# Patient Record
Sex: Male | Born: 1940 | Race: White | Hispanic: No | Marital: Married
Health system: Southern US, Community
[De-identification: ages and names within clinical notes are randomized; demographics above are authoritative.]

---

## 2004-11-01 ENCOUNTER — Ambulatory Visit: Payer: Self-pay | Admitting: Internal Medicine

## 2010-04-09 ENCOUNTER — Inpatient Hospital Stay: Payer: Self-pay | Admitting: Internal Medicine

## 2010-04-11 LAB — PSA

## 2010-11-14 ENCOUNTER — Ambulatory Visit: Payer: Self-pay | Admitting: Dermatology

## 2010-12-06 ENCOUNTER — Ambulatory Visit: Payer: Self-pay | Admitting: Dermatology

## 2010-12-13 ENCOUNTER — Ambulatory Visit: Payer: Self-pay | Admitting: Internal Medicine

## 2011-01-25 ENCOUNTER — Ambulatory Visit: Payer: Self-pay | Admitting: Internal Medicine

## 2011-05-17 ENCOUNTER — Ambulatory Visit: Payer: Self-pay | Admitting: Unknown Physician Specialty

## 2011-05-24 ENCOUNTER — Ambulatory Visit: Payer: Self-pay | Admitting: Rheumatology

## 2011-08-02 ENCOUNTER — Emergency Department: Payer: Self-pay | Admitting: Emergency Medicine

## 2011-08-02 LAB — COMPREHENSIVE METABOLIC PANEL
Albumin: 3.3 g/dL — ABNORMAL LOW (ref 3.4–5.0)
Alkaline Phosphatase: 77 U/L (ref 50–136)
Anion Gap: 9 (ref 7–16)
BUN: 9 mg/dL (ref 7–18)
Bilirubin,Total: 0.4 mg/dL (ref 0.2–1.0)
Calcium, Total: 9.1 mg/dL (ref 8.5–10.1)
Chloride: 93 mmol/L — ABNORMAL LOW (ref 98–107)
Co2: 35 mmol/L — ABNORMAL HIGH (ref 21–32)
Creatinine: 0.46 mg/dL — ABNORMAL LOW (ref 0.60–1.30)
EGFR (African American): >60
EGFR (Non-African Amer.): >60
Glucose: 85 mg/dL (ref 65–99)
Potassium: 3.9 mmol/L (ref 3.5–5.1)
SGOT(AST): 34 U/L (ref 15–37)
SGPT (ALT): 24 U/L
Total Protein: 7.3 g/dL (ref 6.4–8.2)

## 2011-08-02 LAB — CBC
HCT: 39.4 % — ABNORMAL LOW (ref 40.0–52.0)
MCV: 102 fL — ABNORMAL HIGH (ref 80–100)
Platelet: 278 10*3/uL (ref 150–440)
RBC: 3.87 10*6/uL — ABNORMAL LOW (ref 4.40–5.90)
WBC: 12.3 10*3/uL — ABNORMAL HIGH (ref 3.8–10.6)

## 2011-08-02 LAB — TROPONIN I: Troponin-I: <0.02 ng/mL

## 2011-08-03 ENCOUNTER — Inpatient Hospital Stay: Payer: Self-pay | Admitting: Student

## 2011-08-03 LAB — COMPREHENSIVE METABOLIC PANEL
Alkaline Phosphatase: 81 U/L (ref 50–136)
Bilirubin,Total: 0.3 mg/dL (ref 0.2–1.0)
Calcium, Total: 9.7 mg/dL (ref 8.5–10.1)
Chloride: 92 mmol/L — ABNORMAL LOW (ref 98–107)
Co2: 36 mmol/L — ABNORMAL HIGH (ref 21–32)
Creatinine: 0.58 mg/dL — ABNORMAL LOW (ref 0.60–1.30)
EGFR (Non-African Amer.): >60
Osmolality: 271 (ref 275–301)
SGPT (ALT): 24 U/L
Sodium: 136 mmol/L (ref 136–145)

## 2011-08-03 LAB — CBC
MCHC: 34.1 g/dL (ref 32.0–36.0)
Platelet: 291 10*3/uL (ref 150–440)
RDW: 16.5 % — ABNORMAL HIGH (ref 11.5–14.5)
WBC: 10.1 10*3/uL (ref 3.8–10.6)

## 2011-08-04 LAB — BASIC METABOLIC PANEL
Anion Gap: 8 (ref 7–16)
BUN: 17 mg/dL (ref 7–18)
Co2: 36 mmol/L — ABNORMAL HIGH (ref 21–32)
Creatinine: 0.54 mg/dL — ABNORMAL LOW (ref 0.60–1.30)
EGFR (African American): >60
Glucose: 120 mg/dL — ABNORMAL HIGH (ref 65–99)
Osmolality: 278 (ref 275–301)
Sodium: 138 mmol/L (ref 136–145)

## 2011-08-04 LAB — CBC WITH DIFFERENTIAL/PLATELET
Basophil #: 0 10*3/uL (ref 0.0–0.1)
Basophil %: 0 %
Eosinophil %: 0 %
HGB: 12.3 g/dL — ABNORMAL LOW (ref 13.0–18.0)
Lymphocyte #: 0.5 10*3/uL — ABNORMAL LOW (ref 1.0–3.6)
MCH: 33.6 pg (ref 26.0–34.0)
MCV: 101 fL — ABNORMAL HIGH (ref 80–100)
Monocyte #: 0.1 10*3/uL (ref 0.0–0.7)
Neutrophil %: 91.3 %
Platelet: 263 10*3/uL (ref 150–440)
RDW: 16.5 % — ABNORMAL HIGH (ref 11.5–14.5)

## 2011-08-04 LAB — MAGNESIUM: Magnesium: 1.9 mg/dL

## 2011-08-04 LAB — PROTIME-INR
INR: 0.9
Prothrombin Time: 12 secs (ref 11.5–14.7)

## 2011-08-06 LAB — EXPECTORATED SPUTUM ASSESSMENT W GRAM STAIN, RFLX TO RESP C

## 2011-08-07 LAB — CULTURE, BLOOD (SINGLE)

## 2011-08-08 LAB — CULTURE, BLOOD (SINGLE)

## 2012-04-04 ENCOUNTER — Inpatient Hospital Stay: Payer: Self-pay | Admitting: Internal Medicine

## 2012-04-04 LAB — COMPREHENSIVE METABOLIC PANEL
Alkaline Phosphatase: 160 U/L — ABNORMAL HIGH (ref 50–136)
Anion Gap: 11 (ref 7–16)
Bilirubin,Total: 0.7 mg/dL (ref 0.2–1.0)
Chloride: 85 mmol/L — ABNORMAL LOW (ref 98–107)
Co2: 39 mmol/L — ABNORMAL HIGH (ref 21–32)
EGFR (Non-African Amer.): >60
Osmolality: 269 (ref 275–301)
Potassium: 2.8 mmol/L — ABNORMAL LOW (ref 3.5–5.1)
SGPT (ALT): 21 U/L (ref 12–78)
Sodium: 135 mmol/L — ABNORMAL LOW (ref 136–145)
Total Protein: 6.3 g/dL — ABNORMAL LOW (ref 6.4–8.2)

## 2012-04-04 LAB — ETHANOL: Ethanol: 55 mg/dL

## 2012-04-04 LAB — CBC WITH DIFFERENTIAL/PLATELET
Basophil %: 0.2 %
Eosinophil #: 0 10*3/uL (ref 0.0–0.7)
Eosinophil %: 0.4 %
HGB: 10.3 g/dL — ABNORMAL LOW (ref 13.0–18.0)
Lymphocyte #: 0.1 10*3/uL — ABNORMAL LOW (ref 1.0–3.6)
Lymphocyte %: 1.4 %
MCH: 34.8 pg — ABNORMAL HIGH (ref 26.0–34.0)
MCV: 104 fL — ABNORMAL HIGH (ref 80–100)
Monocyte #: 0.2 x10 3/mm (ref 0.2–1.0)
Monocyte %: 1.9 %
Neutrophil #: 9.9 10*3/uL — ABNORMAL HIGH (ref 1.4–6.5)
Neutrophil %: 96.1 %
Platelet: 214 10*3/uL (ref 150–440)
RBC: 2.97 10*6/uL — ABNORMAL LOW (ref 4.40–5.90)
WBC: 10.3 10*3/uL (ref 3.8–10.6)

## 2012-04-04 LAB — PROTIME-INR: Prothrombin Time: 12 secs (ref 11.5–14.7)

## 2012-04-04 LAB — CK TOTAL AND CKMB (NOT AT ARMC): CK, Total: 33 U/L — ABNORMAL LOW (ref 35–232)

## 2012-04-05 LAB — CBC WITH DIFFERENTIAL/PLATELET
Basophil %: 0.1 %
Eosinophil %: 0 %
HCT: 24.4 % — ABNORMAL LOW (ref 40.0–52.0)
HGB: 8.5 g/dL — ABNORMAL LOW (ref 13.0–18.0)
Lymphocyte #: 0.2 10*3/uL — ABNORMAL LOW (ref 1.0–3.6)
MCH: 36.1 pg — ABNORMAL HIGH (ref 26.0–34.0)
MCV: 104 fL — ABNORMAL HIGH (ref 80–100)
Monocyte #: 0.2 x10 3/mm (ref 0.2–1.0)
Neutrophil #: 5.5 10*3/uL (ref 1.4–6.5)
RBC: 2.35 10*6/uL — ABNORMAL LOW (ref 4.40–5.90)
WBC: 5.9 10*3/uL (ref 3.8–10.6)

## 2012-04-05 LAB — BASIC METABOLIC PANEL
Calcium, Total: 7.2 mg/dL — ABNORMAL LOW (ref 8.5–10.1)
Co2: 39 mmol/L — ABNORMAL HIGH (ref 21–32)
Creatinine: 0.43 mg/dL — ABNORMAL LOW (ref 0.60–1.30)
EGFR (African American): >60
EGFR (Non-African Amer.): >60
Potassium: 3.4 mmol/L — ABNORMAL LOW (ref 3.5–5.1)
Sodium: 139 mmol/L (ref 136–145)

## 2012-04-07 LAB — POTASSIUM: Potassium: 4.4 mmol/L (ref 3.5–5.1)

## 2012-04-07 LAB — MAGNESIUM: Magnesium: 2.3 mg/dL

## 2012-04-10 LAB — CULTURE, BLOOD (SINGLE)

## 2012-09-12 ENCOUNTER — Observation Stay: Payer: Self-pay | Admitting: Internal Medicine

## 2012-09-12 LAB — TSH: Thyroid Stimulating Horm: 1.38 u[IU]/mL

## 2012-09-12 LAB — COMPREHENSIVE METABOLIC PANEL
Albumin: 2.9 g/dL — ABNORMAL LOW (ref 3.4–5.0)
Alkaline Phosphatase: 146 U/L — ABNORMAL HIGH (ref 50–136)
Anion Gap: 2 — ABNORMAL LOW (ref 7–16)
BUN: 13 mg/dL (ref 7–18)
Bilirubin,Total: 0.4 mg/dL (ref 0.2–1.0)
Calcium, Total: 8.2 mg/dL — ABNORMAL LOW (ref 8.5–10.1)
Creatinine: 0.52 mg/dL — ABNORMAL LOW (ref 0.60–1.30)
EGFR (African American): >60
Glucose: 100 mg/dL — ABNORMAL HIGH (ref 65–99)
Osmolality: 270 (ref 275–301)
SGPT (ALT): 32 U/L (ref 12–78)
Sodium: 135 mmol/L — ABNORMAL LOW (ref 136–145)

## 2012-09-12 LAB — CBC
HGB: 9.4 g/dL — ABNORMAL LOW (ref 13.0–18.0)
MCV: 92 fL (ref 80–100)
WBC: 19.8 10*3/uL — ABNORMAL HIGH (ref 3.8–10.6)

## 2012-09-12 LAB — TROPONIN I: Troponin-I: <0.02 ng/mL

## 2012-09-13 LAB — CBC WITH DIFFERENTIAL/PLATELET
Basophil %: 0.2 %
Eosinophil #: 0.1 10*3/uL (ref 0.0–0.7)
HGB: 8.4 g/dL — ABNORMAL LOW (ref 13.0–18.0)
Lymphocyte #: 1 10*3/uL (ref 1.0–3.6)
Lymphocyte %: 10.6 %
MCHC: 32.9 g/dL (ref 32.0–36.0)
MCV: 93 fL (ref 80–100)
Monocyte #: 0.8 x10 3/mm (ref 0.2–1.0)
Monocyte %: 8 %
Neutrophil #: 7.9 10*3/uL — ABNORMAL HIGH (ref 1.4–6.5)
Platelet: 246 10*3/uL (ref 150–440)
RBC: 2.76 10*6/uL — ABNORMAL LOW (ref 4.40–5.90)

## 2012-09-13 LAB — BASIC METABOLIC PANEL
BUN: 10 mg/dL (ref 7–18)
Chloride: 97 mmol/L — ABNORMAL LOW (ref 98–107)
Co2: 41 mmol/L (ref 21–32)
EGFR (African American): >60
EGFR (Non-African Amer.): >60
Osmolality: 272 (ref 275–301)
Sodium: 137 mmol/L (ref 136–145)

## 2012-09-16 ENCOUNTER — Ambulatory Visit: Payer: Self-pay | Admitting: Internal Medicine

## 2012-10-02 ENCOUNTER — Inpatient Hospital Stay: Payer: Self-pay | Admitting: Internal Medicine

## 2012-10-02 LAB — BASIC METABOLIC PANEL
Anion Gap: 3 — ABNORMAL LOW (ref 7–16)
BUN: 17 mg/dL (ref 7–18)
Calcium, Total: 9.3 mg/dL (ref 8.5–10.1)
Chloride: 88 mmol/L — ABNORMAL LOW (ref 98–107)
Co2: 44 mmol/L (ref 21–32)
EGFR (Non-African Amer.): >60
Osmolality: 273 (ref 275–301)
Potassium: 3.9 mmol/L (ref 3.5–5.1)
Sodium: 135 mmol/L — ABNORMAL LOW (ref 136–145)

## 2012-10-02 LAB — CBC WITH DIFFERENTIAL/PLATELET
Basophil #: 0 10*3/uL (ref 0.0–0.1)
Eosinophil %: 0.1 %
HCT: 29.2 % — ABNORMAL LOW (ref 40.0–52.0)
HGB: 9.7 g/dL — ABNORMAL LOW (ref 13.0–18.0)
Lymphocyte %: 2 %
MCH: 30.4 pg (ref 26.0–34.0)
MCHC: 33 g/dL (ref 32.0–36.0)
MCV: 92 fL (ref 80–100)
Monocyte #: 0.2 x10 3/mm (ref 0.2–1.0)
Monocyte %: 1.7 %
Neutrophil #: 11.5 10*3/uL — ABNORMAL HIGH (ref 1.4–6.5)
Neutrophil %: 96.1 %
Platelet: 271 10*3/uL (ref 150–440)
WBC: 11.9 10*3/uL — ABNORMAL HIGH (ref 3.8–10.6)

## 2012-10-05 LAB — BASIC METABOLIC PANEL
Anion Gap: 1 — ABNORMAL LOW (ref 7–16)
Creatinine: 0.44 mg/dL — ABNORMAL LOW (ref 0.60–1.30)
EGFR (African American): >60
EGFR (Non-African Amer.): >60
Glucose: 112 mg/dL — ABNORMAL HIGH (ref 65–99)
Osmolality: 274 (ref 275–301)
Potassium: 3.5 mmol/L (ref 3.5–5.1)

## 2012-10-16 ENCOUNTER — Ambulatory Visit: Payer: Self-pay | Admitting: Internal Medicine

## 2012-11-16 DEATH — deceased

## 2013-05-14 IMAGING — CT CT CHEST W/ CM
1 of 2 series · 14 of 31 positions shown, 18 images · non-contrast
Comparison: none

REASON FOR EXAM: SOB
COMMENTS:

[Series 2: chest w/ 5.0 i41f 3 · axial · 0.78mm/px · z∈[-386,-111]mm · 14 of 65 slices shown, 18 images]
[im 5/65  mediastinal]
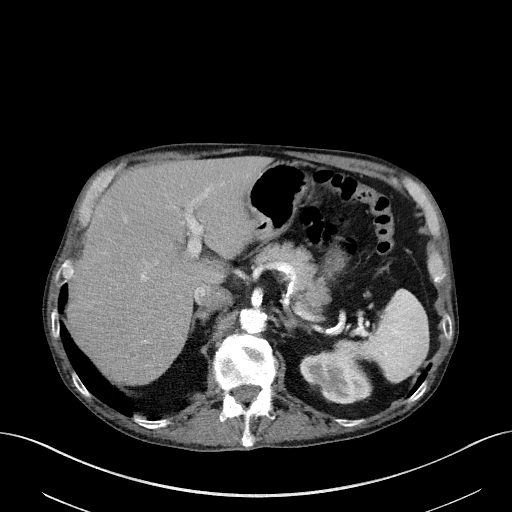
[im 5/65  lung]
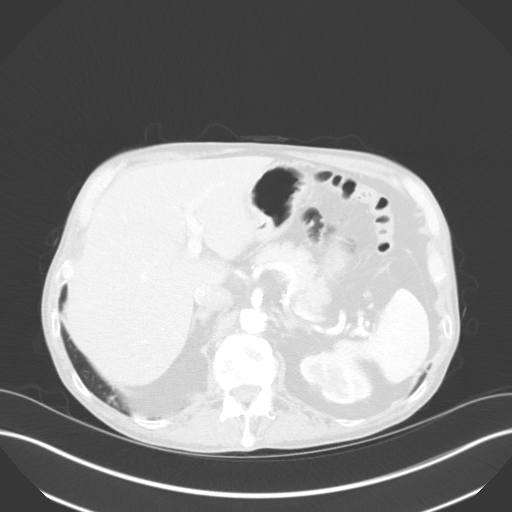
[im 10/65  lung]
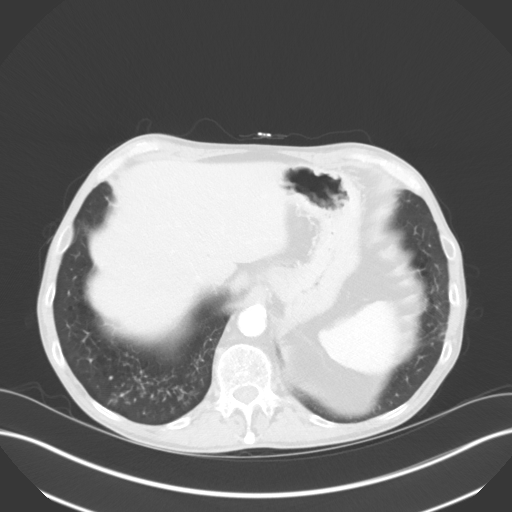
[im 15/65  lung]
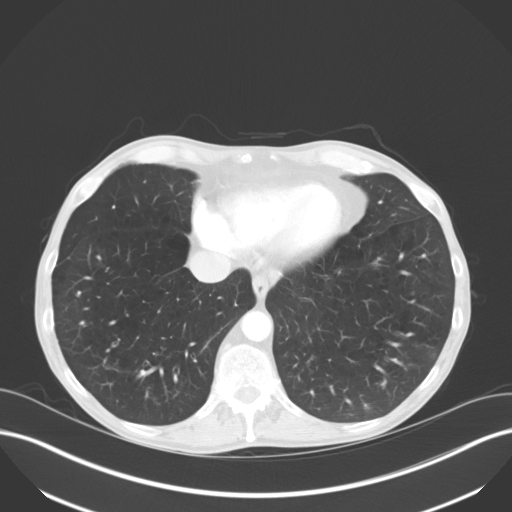
[im 20/65  lung]
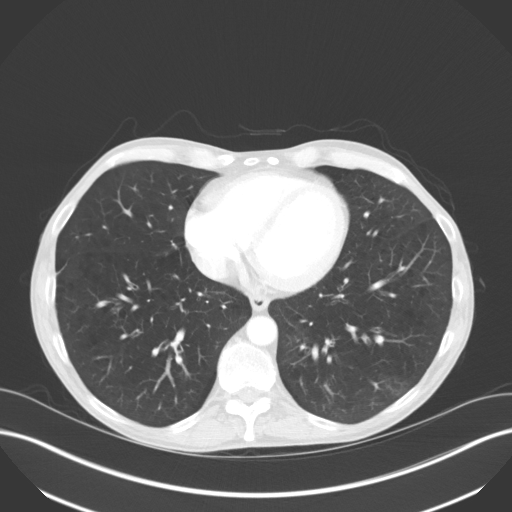
[im 25/65  mediastinal]
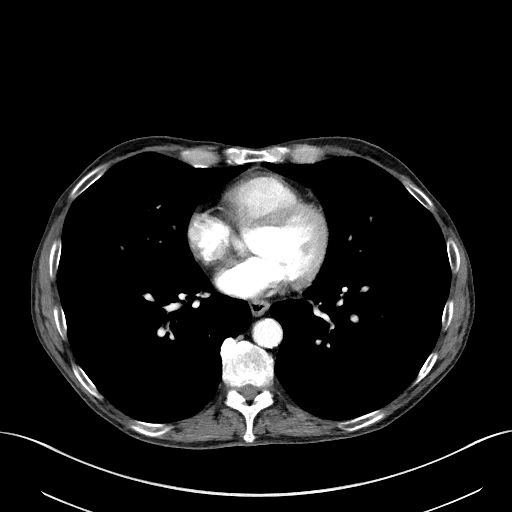
[im 25/65  lung]
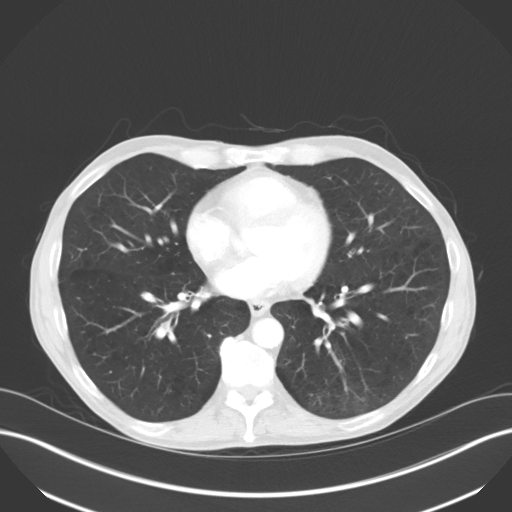
[im 30/65  lung]
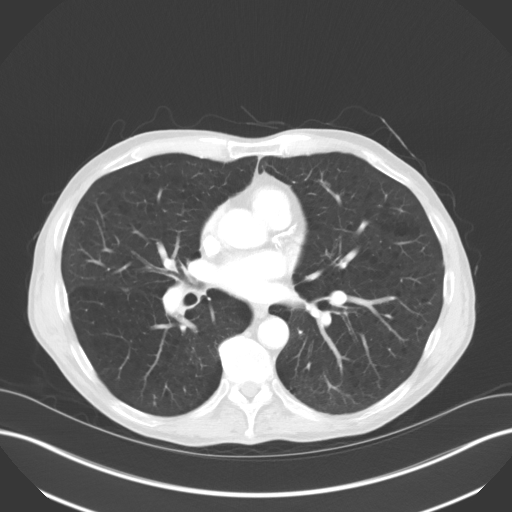
[im 31/65  lung]
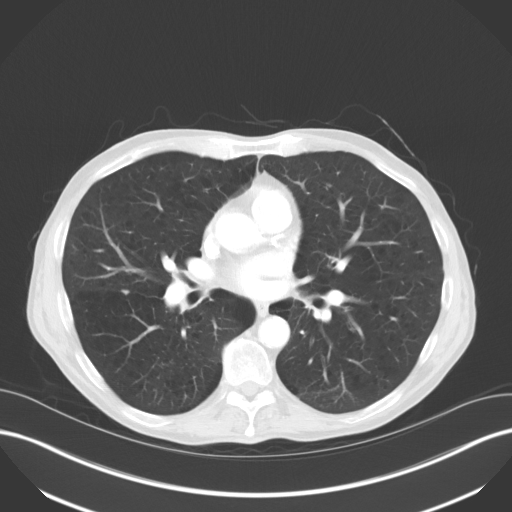
[im 33/65  lung]
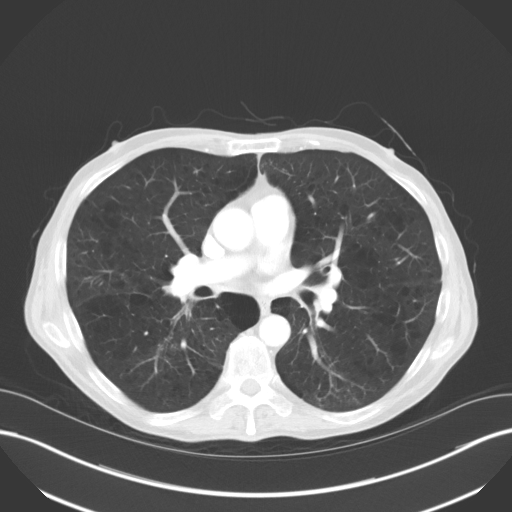
[im 35/65  mediastinal]
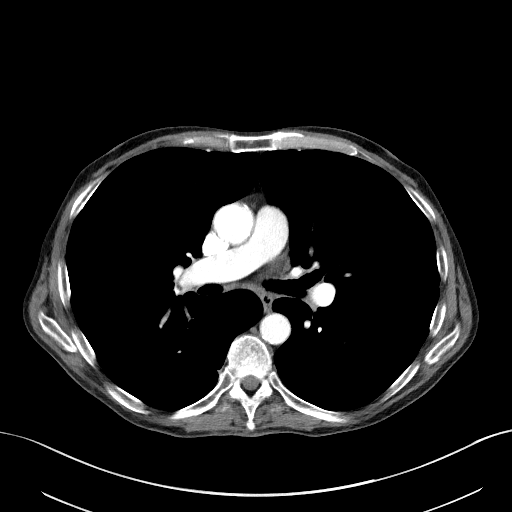
[im 35/65  lung]
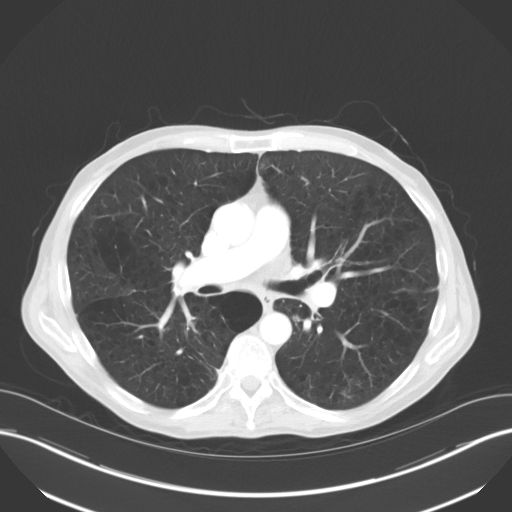
[im 40/65  lung]
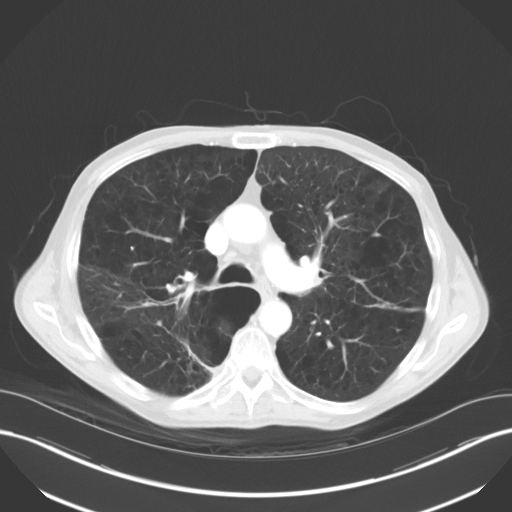
[im 45/65  lung]
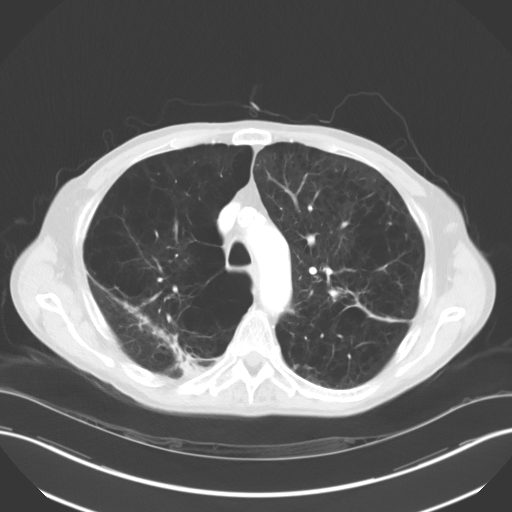
[im 50/65  lung]
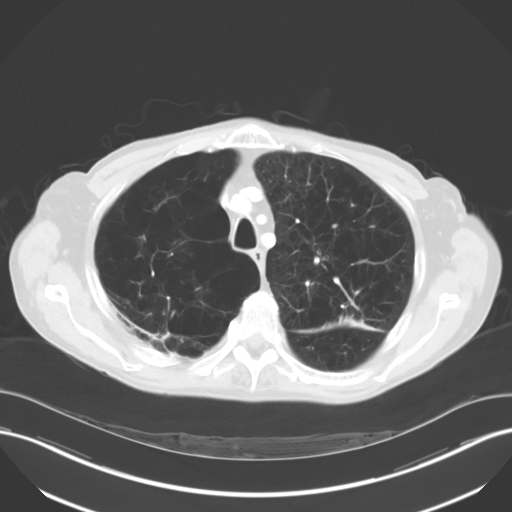
[im 55/65  mediastinal]
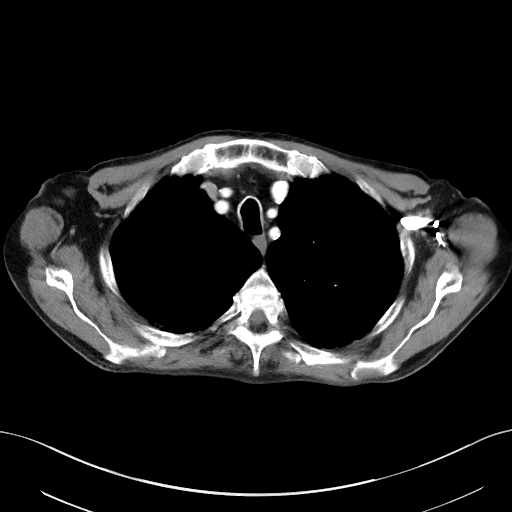
[im 55/65  lung]
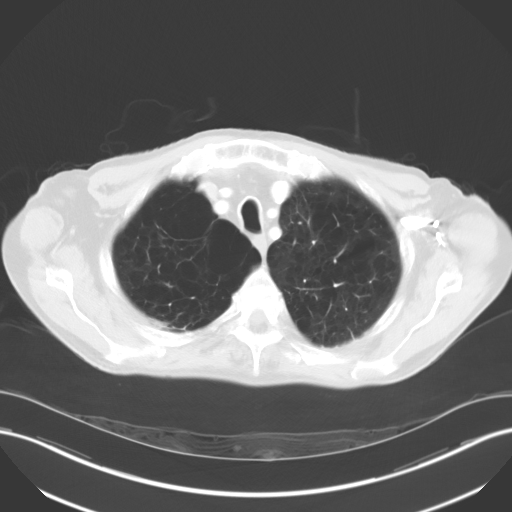
[im 60/65  lung]
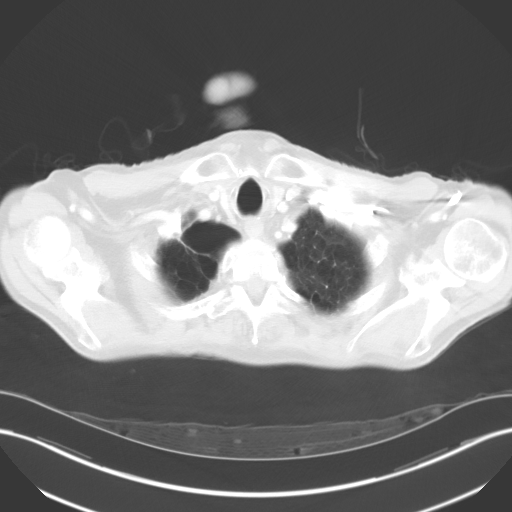

[14 of 31 positions shown; findings below may reference images not displayed]

PROCEDURE:     KCT - KCT CHEST WITH CONTRAST  - December 13, 2010  [DATE]

RESULT:     Axial CT scanning was performed through the chest at 5 mm
intervals and slice thicknesses. Review of multiplanar reconstructed images
was performed separately on the VIA monitor. The patient received 75 cc of
Dsovue-AVY intravenously. Comparison is made to a study 11 May, 2010.

At lung window settings there are emphysematous changes bilaterally. Large
bullous lesions are present predominantly in the upper lobes. I see no
alveolar infiltrates. Minimally increased interstitial density in the right
lower lobe posteriorly is less conspicuous than that seen on previous
studies.

At mediastinal window settings the cardiac chambers are normal in size. The
caliber of the thoracic aorta is normal. The central pulmonary vascularity
exhibits no acute abnormality. No pathologic sized mediastinal or hilar
lymph nodes are evident. The thoracic vertebral bodies are preserved in
height. Mild superior endplate depression of L1 or L2 is noted. Within the
upper abdomen the observed portions of the liver and spleen and pancreas
appear normal. I see no adrenal masses.
IMPRESSION: 1. Extensive bullous emphysematous changes in both lungs do not appear
significantly changed since the previous study. There is no evidence of a
pneumothorax or pneumomediastinum.
2. I do not see evidence of pneumonia nor CHF. No pulmonary parenchymal
masses are identified.
3. I see no acute thoracic aortic pathology.

## 2014-10-05 NOTE — Discharge Summary (Signed)
PATIENT NAME:  Don [name], Don [name] MR#:  [id number] DATE OF BIRTH:  Nov 21, 1940  DATE OF ADMISSION:  04/04/2012 DATE OF DISCHARGE:  04/08/2012  DISCHARGE DIAGNOSES:  1. Acute on chronic respiratory failure with chronic obstructive pulmonary disease exacerbation, on 3 liters oxygen by nasal cannula which is his baseline, on steroid taper and Levaquin, improving.  2. Hypokalemia and hypomagnesemia, replaced and resolved. 3. Alcohol abuse and withdrawal. Recommended outpatient Alcoholics Anonymous program.  4. Anemia of chronic disease, stable hemodynamically. No obvious bleeding.   SECONDARY DIAGNOSES:  1. Hypertension.  2. Chronic respiratory failure requiring 3 to 4 liters oxygen at home.  3. Left eye blindness. 4. Alcohol and tobacco abuse.  5. Severe dry skin.   CONSULTATIONS: None.   PROCEDURES/RADIOLOGY: Chest x-ray on October 18th showed no acute cardiopulmonary disease. COPD present.   MAJOR LABORATORY PANEL: Blood cultures x2 were negative on 04/04/2012.   HISTORY AND SHORT HOSPITAL COURSE: The patient is a 74 year old male with above-mentioned medical problems who was admitted for acute on chronic respiratory failure thought to be secondary to COPD exacerbation. The patient was started on IV steroids and antibiotics. He also had some electrolyte disturbances with hypokalemia and hypomagnesemia which was repleted and resolved. The patient was evaluated by Psychiatry, Dr. Guss Bundehalla, considering alcoholism. She requested inpatient CIWA protocol and outpatient Alcoholics Anonymous if the patient is agreeable. The patient was evaluated by physical therapy who recommended home with home health. The patient was slowly improving and was close to his baseline on October 22nd.   On the day of discharge, his vital signs were as follows: Temperature 97.9, heart rate 95 per minute, respirations 20 per minute, blood pressure 128/62 mmHg. He was saturating 98% on 3 liters oxygen via nasal cannula.    PERTINENT PHYSICAL EXAMINATION ON THE DATE OF DISCHARGE: CARDIOVASCULAR: S1, S2 normal. No murmurs, rubs, or gallops. LUNGS: Clear to auscultation bilaterally. No wheezing, rales, rhonchi, or crepitation. ABDOMEN: Soft, benign. NEUROLOGIC: Nonfocal examination. All other physical examination remained at the baseline.   DISCHARGE MEDICATIONS:  1. Symbicort 2 puffs inhaled twice a day. 2. ProAir 2 puffs inhaled 4 times a day as needed.  3. One-A-Day Men's Vitamin once daily. 4. Spiriva once daily.  5. Mirtazapine 15 mg p.o. 1 to 2 times a day.  6. Amlodipine 5 mg p.o. daily.  7. Prednisone 60 mg p.o. daily, taper by 10 mg daily until finished.  8. Levaquin 500 mg p.o. daily for two more days.   DISCHARGE DIET: Low sodium.   DISCHARGE ACTIVITY: As tolerated.   DISCHARGE INSTRUCTIONS AND FOLLOW-UP:  1. The patient was instructed follow-up with his primary care physician, Dr. Freda MunroSaadat Khan, in 1 to 2 weeks.  2. He was recommended to go to Alcoholics Anonymous Program as outpatient in 2 to 3 weeks.  3. Will need 3 liters oxygen via nasal cannula continuous at home as per his chronic requirement.  4. He was set up to get home health physical therapy nurse and nursing aide.   TOTAL TIME DISCHARGING THIS PATIENT: 55 minutes.   ____________________________ Ellamae SiaVipul S. Sherryll BurgerShah, MD vss:drc [name]: 04/10/2012 15:45:58 ET T: 04/11/2012 10:15:28 ET JOB#: [id number]  cc: Arland Usery S. Sherryll BurgerShah, MD, <Dictator>, Yevonne PaxSaadat A. Khan, MD, Jannet MantisSurya K. Guss Bundehalla, MD Patricia PesaVIPUL S Madox Corkins MD ELECTRONICALLY SIGNED 04/11/2012 11:27

## 2014-10-05 NOTE — Consult Note (Signed)
PATIENT NAME:  Don [name], Don [name] MR#:  [id number] DATE OF BIRTH:  1941/03/08  DATE OF CONSULTATION:  04/05/2012  REFERRING PHYSICIAN:   CONSULTING PHYSICIAN:  Courtni Balash K. Arrow Tomko, MD  SUBJECTIVE: The patient is a 74 year old white male who is retired after working in [employer]. The patient is divorced for many years and lives by himself in a townhouse. [facility] with chief complaint "I am sick; I am having breathing problems".   HISTORY OF PRESENT ILLNESS: The patient reports he has been sick physically and came [facility]. In addition, he reports he has been drinking alcohol for which he requested help.   PAST PSYCHIATRIC HISTORY: No previous history of inpatient hospitalizations on Psychiatry. No history of suicide attempts. Not being followed by any psychiatrist at this time.   ALCOHOL AND DRUGS: First drink of alcohol many years ago. Admits that he has been drinking at a rate of one pint of gin per day for the past 10 years or more. Had one DWI and got his driver's license back. Was arrested for public drunkenness once. No history of DTs, tremors, seizures, or blackouts. Denies street or prescription drug abuse. Denies using IV drugs. Does admit smoking nicotine cigarettes and reports "I already quit right now".   MENTAL STATUS EXAMINATION: The patient is dressed in hospital clothes. Alert and oriented but he said it was October 20th when it is the 19th. He knew the month and the year. Affect is neutral and . mood stable. Denies feeling depressed. Denies feeling hopeless or helpless. Does admit feeling worthless and useless sometimes because of his alcohol drinking and he is glad that he decided to give it up. He denies any ideas to hurt himself or others. No evidence of psychosis. Denies auditory or visual hallucinations. Denies hearing voices or seeing things. Denies paranoid or suspicious ideas. Thought processes are logical and goal directed. He could count money except that  he said 4 quarters, 10 dimes, 25 nickels, which is wrong, and 100 pennies in a dollar. He knew his date of birth. Regarding memory and recall, he could remember 2 of the 3 objects asked him in a few minutes and several minutes. He could spell the word world forward but he had to think very hard to spell it backward and refused to do so. Has decided to give up his alcohol drinking and he realizes that he needs help. Insight and judgment fair.     IMPRESSION: AXIS I:  1. Alcohol dependence, chronic, continuous for 10 years or more.  2. Nicotine dependence for many years.   PLAN AND RECOMMENDATIONS:  1. Continue CIWA at this time.  2. Please request a consult from Social Services so they can refer patient to IOP to Dr. Maisie Fushomas or appropriate program where he can get help for his alcohol problems as he has decided to get help so that he can quit and stay sober.   ____________________________ Jannet MantisSurya K. Guss Bundehalla, MD skc:drc [name]: 04/05/2012 17:01:24 ET T: 04/06/2012 11:57:08 ET JOB#: [id number]  cc: Monika SalkSurya K. Guss Bundehalla, MD, <Dictator> Beau FannySURYA K Khaleesi Gruel MD ELECTRONICALLY SIGNED 04/06/2012 16:43

## 2014-10-05 NOTE — H&P (Signed)
PATIENT NAME:  Don [name], Don [name] MR#:  [id number] DATE OF BIRTH:  Jan 20, 1941  DATE OF ADMISSION:  04/04/2012  ADMITTING PHYSICIAN: Don [name]  PRIMARY CARE PHYSICIAN: Dr. Beverely RisenFozia [name] PRIMARY PULMONOLOGIST: Dr. Freda MunroSaadat [name]  CHIEF COMPLAINT: Brought in by wife saying he needs help with alcohol detox and difficulty breathing.   HISTORY OF PRESENT ILLNESS: Don [name] is a 74 year old Caucasian male with past medical history significant for chronic obstructive pulmonary disease on home oxygen, emphysema, chronic bronchitis and alcohol abuse was brought in secondary to more frequent falls lately, very decreased poor p.o. intake over the past week and needing help with alcohol detox. Patient received Ativan prior to my exam for his detox and unable to provide any history. Most of the history is obtained from his wife. According to wife they have been separated for long because of his history of alcohol use and he has been drinking about a pink of gin every day and he continues to smoke about a pack per day. He has been falling more lately, he has not eaten anything over the past week and just drinking alcohol. He called her this morning and see if she could help him with the alcohol detox so she brought him to the ED. He was found to be in chronic obstructive pulmonary disease exacerbation while he was here and his lab work reveals severe electrolyte abnormalities. He is being admitted to medicine and psych will be consulted.   PAST MEDICAL HISTORY:  1. Hypertension.  2. Chronic respiratory failure secondary to chronic obstructive pulmonary disease on 3 to 4 liters of oxygen at home.  3. Left eye blindness.  4. Tobacco use disorder.  5. Alcohol abuse.  6. Severe dry skin for which he is started on oral prednisone.   PAST SURGICAL HISTORY:  1. Right arm tendon surgery from trauma. 2. Cataract surgery.   ALLERGIES TO MEDICATIONS: Aspirin.   CURRENT HOME MEDICATIONS: He was supposed to be on  following medications but the wife is not sure if patient is taking them or not.  1. Amlodipine 5 mg p.o. daily.  2. Remeron 15 mg at bedtime.  3. Vitamins 1 tablet daily.  4. Prednisone 20 mg p.o. daily.  5. ProAir 2 puffs q.6 hours p.r.n.  6. Spiriva 18 mcg inhalation daily.  7. Symbicort 160/4.8, 2 puffs [name]   SOCIAL HISTORY: Separated from his wife, lives at home by himself. Smokes about 1 pack per day and drinks about 1 pint, currently drinking gin every day. No drug use.   FAMILY HISTORY: Mom died from stroke and father also had alcoholic problems and died in his 8760s.   REVIEW OF SYSTEMS: Difficult to be obtained secondary to patient's mental status.   PHYSICAL EXAMINATION:  VITAL SIGNS: Temperature 98.3 degrees Fahrenheit, pulse 126, blood pressure 109/58, pulse oximetry 83% on room air.   GENERAL: Very thin built, ill nourished male sitting in bed, very drowsy on my exam.    HEENT: Normocephalic, atraumatic. Pupils equal, round, reacting to light. Anicteric sclerae. Extraocular movements intact. Poor oral dentition. Oropharynx clear without erythema, mass or exudates.   NECK: Supple. No thyromegaly, jugular venous distention or carotid bruits. No lymphadenopathy.  LUNGS: He has bilateral air entry but has diffuse expiratory wheeze bilaterally. No use of accessory muscles for breathing. No rales or rhonchi.   CARDIOVASCULAR: S1, S2 regular rate and rhythm. No murmurs, rubs, or gallops.   ABDOMEN: Soft, nontender, nondistended. No hepatosplenomegaly. Normal bowel sounds.   EXTREMITIES:  No pedal edema. No clubbing or cyanosis. 2+ dorsalis pedis pulses palpable bilaterally.   SKIN: Very dry skin with scratch marks present all over.    LYMPHATICS: No cervical lymphadenopathy.   NEUROLOGIC: Unable to do a complete neurological exam secondary to patient's mental status. Face does not show any obvious fascial deficits and is able to move all four extremities.   PSYCH:  Currently sleepy after the Ativan.  LABORATORY, DIAGNOSTIC AND RADIOLOGICAL DATA:  WBC 10.3, hemoglobin 10.3, hematocrit 30.9, platelet count 214.   Sodium 135, potassium 2.8, chloride 85, bicarbonate 39, BUN 6, creatinine 0.56, glucose 126, calcium 8.2.   ALT 21, AST 38, alkaline phosphatase 160, total bilateral 0.7, albumin 3.6, CK 33, CK-MB less than 0.5, troponin less than 0.02. Alcohol level is negative. INR 0.9. PTT 34.2. Chest x-ray showing no cardiopulmonary abnormality, chronic changes of chronic obstructive pulmonary disease, and possible pulmonary hypertension and fibrotic changes are present. Lateral left seventh rib fracture is present. No pneumothorax is evident. Magnesium 1.4.   EKG showing sinus tachycardia, heart rate of 124.   ASSESSMENT AND PLAN: This is a 74 year old male with past medical history of chronic obstructive pulmonary disease, chronic bronchitis, alcohol abuse and smoking admitted for chronic obstructive pulmonary disease exacerbation and electrolyte abnormalities seen on labs. Initially brought to ER for possible alcohol detox but medically unstable at this time.  1. Acute on chronic obstructive pulmonary disease exacerbation. Will start on Solu-Medrol, oxygen support, Symbicort, Spiriva and neb treatments. Sputum and blood cultures and start prophylactic Levaquin for bronchitis.  2. Hypokalemia and hypomagnesemia started from poor p.o. intake and some diarrhea last week. Replace and recheck.  3. Alcohol abuse and withdrawal on CIWA protocol and psych consultation. Family very interested in detox. Social worker consult and physical therapy consult. 4. Tobacco use disorder. Currently drowsy, needs counseling prior to discharge and nicotine patch if needed.  5. Elevated LFTs from alcoholic liver disease. Continue to monitor.  6. Dry skin eczema on oral prednisone. Now that he is on Solu-Medrol will hold off on the prednisone.  7. CODE STATUS: FULL CODE.   TIME SPENT  ON ADMISSION: 50 minutes.  ____________________________ Don Baas, MD rk:cms [name]: 04/04/2012 16:17:27 ET T: 04/04/2012 17:19:07 ET JOB#: [id number]  cc: Don Baas, MD, <Dictator> Lyndon Code, MD [name], MD Don Baas MD ELECTRONICALLY SIGNED 04/08/2012 14:05

## 2014-10-08 NOTE — Discharge Summary (Signed)
PATIENT NAME:  Don [name], Don [name] MR#:  [id number] DATE OF BIRTH:  June 19, 1940  DATE OF ADMISSION:  09/12/2012 DATE OF DISCHARGE:  09/15/2012  PRIMARY CARE PHYSICIAN: Yevonne PaxSaadat A. Khan, MD   DISCHARGE DIAGNOSES:  1. Pulmonary contusion with a rib fracture due to fall.  2. Right rib fracture.  3. Chronic obstructive pulmonary disease.  4. Hypertension.   CONDITION: Stable.   CODE STATUS:  FULL CODE.     HOME MEDICATIONS: Symbicort 160 mcg/2.5 mcg inhalation 2 puffs [name], ProAir HFA 90 mcg inhalation 2 puffs 4 times a day p.r.n., Norvasc 5 mg p.o. at bedtime, Central-Vite 1 tablet once a day, prednisone 10 mg p.o. 2 tablets once a day, Spiriva 18 mcg inhalation 1 cap inhaler once a day.   REFERRALS:  The patient needs home health with physical therapy.   HOME OXYGEN: He needs 3 liters by nasal cannula.   DIET: Low sodium.   ACTIVITY: As tolerated.   FOLLOW-UP CARE: With PCP within 1 to 2 weeks.   REASON FOR ADMISSION: Fall.   HOSPITAL COURSE: The patient is a 74 year old gentleman with history of severe COPD, on home oxygen 2 to 4 liters, was sent to the ED due to fall. In the ED, the patient was noted to have tachycardia and a heart rate of 126. CAT scan of the head and neck, chest and cervical spine showed diffuse degenerative changes, no acute abnormality. The patient was admitted for observation and management of a pulmonary contusion. For a detailed history and physical examination, please refer to the admission note dictated by Dr. Mordecai MaesSanchez.    1. Pulmonary contusion and rib fracture on the right side:  The patient received pain medications with a nebulizer and oxygen.  Chest pain has been improving.  2. Chronic respiratory failure: The patient has been treated with oxygen by nasal cannula.  3. Status post a fall: The patient is placed on fall precautions.  4. Chronic obstructive pulmonary disease: The patient has been treated with a nebulizer and home oxygen.  5. Tachycardia: Has  improved after the above-mentioned treatment.   The patient is clinically stable and will be discharged to home with home health and PT today. I discussed the patient's discharge plan with the patient and the case manager.   TIME SPENT: About 36 minutes.   ____________________________ Shaune PollackQing Thurma Priego, MD [name]: 09/15/2012 16:06:03 ET T: 09/15/2012 22:22:16 ET JOB#: [id number]  cc: Shaune PollackQing Jin Capote, MD, <Dictator> Shaune PollackQING Cyntia Staley MD ELECTRONICALLY SIGNED 09/16/2012 18:26

## 2014-10-08 NOTE — H&P (Signed)
PATIENT NAME:  Don [name], Don [name] MR#:  [id number] DATE OF BIRTH:  Jul 07, 1940  DATE OF ADMISSION:  09/12/2012  REFERRING PHYSICIAN: Dr. Enedina Finner.  PRIMARY CARE PHYSICIAN: Dr. Beverely Risen.  PULMONARY: Dr. Freda Munro.  CHIEF COMPLAINT: Fall.   HISTORY OF PRESENT ILLNESS: The patient is a nice 74 year old gentleman who has history of severe chronic respiratory failure with COPD, on oxygen at home 2 to 4 L/min. He is a smoker. He is a drinker. He has a history of severe dry skin, for what he takes chronic prednisone. He also has hypertension, which apparently has been well controlled. The patient comes today with chief complaint of a fall. He got up this morning to go to the bathroom. Whenever he was getting through the door, he was hooked up to his oxygen and he tripped and fell on his oxygen line. He landed on his right side and he had significant bleeding from a laceration on the occipital area of his head and significant pain at the level of the rib cage posteriorly on the right side. The pain in his ribs is 8 or 9 out of 10 whenever he takes deep breaths and there is no significant increase in his shortness of breath. He just cannot take really deep breaths. The patient overall has been doing okay. He has been smoking on and off. He says that he quit for a month and came back, and right now he is smoking about 1/2 pack a day and he is drinking beer every couple of days he states. The patient was evaluated by Dr. Enedina Finner who found him to be severely tachycardic with a heart rate of 126. The patient states that his heart rate is always high in the 120s. His temperature is 98.1 and his respirations were 16. He had CT scans of the head and neck and chest and cervical spine, showed diffuse degenerative changes, no acute abnormalities. CT of the head shows no acute intracranial abnormality, some swelling noted in the right occiput where the laceration is and some left maxillary sinusitis, which is likely chronic. The  patient is admitted for observation and management of the pulmonary contusion.   REVIEW OF SYSTEMS:    CONSTITUTIONAL: Denies any fever, fatigue, weakness, weight loss or weight gain.  EYES: The patient is blind on his left side. No eye pain. No inflammation. No glaucoma. Positive cataracts.  ENT: No tinnitus. No ear pain. No sinus pain. The patient has sinusitis, which is chronic, on the CT scan but no symptoms. No postnasal drip. No difficulty swallowing. RESPIRATORY: Positive cough. Positive wheezing. Positive dyspnea. Positive very painful respirations due to fractures and contusion. Positive COPD. Positive chronic respiratory failure, on 3 to 4 liters of oxygen.  CARDIOVASCULAR: No chest pain, orthopnea, edema, syncope or palpitations. He states that he is always tachycardic in the 120s.  GASTROINTESTINAL: No nausea, vomiting, diarrhea. No abdominal pain. No jaundice.  GENITOURINARY: No dysuria, hematuria, frequency or incontinence. No prostatitis.  ENDOCRINE: No polyuria, polydipsia or polyphagia. No cold or heat intolerance.  HEMATOLOGIC AND LYMPHATIC: No significant easy bruising, bleeding or swollen glands. He has anemia, which is chronic disease, but also he had positive blood in feces on previous admission. On his discharge summary, there was no followup for GI. Since we are going to admit this patient for observation, we are going to recommend him to go to see his primary care physician for followup and possible colonoscopy at some point.  SKIN: Significant dry skin, for what the  patient takes prednisone. No changing of lesions. No bleeding on the skin.  MUSCULOSKELETAL: Positive head pain, neck pain and lower back and rib cage pain. No swelling. No gout.  NEUROLOGIC: No numbness, tingling. No CVA. No TIA. No ataxia.  PSYCHIATRIC: No significant insomnia, depression. The patient is a little bit anxious and that could be related to alcohol use.   PAST MEDICAL HISTORY:  1.  Chronic  respiratory failure, on 2 to 3 liters of oxygen.  2.  COPD.  3.  Hypertension.  4.  Left eye blindness.  5.  Current tobacco abuse.  6.  Current alcohol abuse.  7.  Severe dry skin.   ALLERGIES: ASPIRIN.   PAST SURGICAL HISTORY: Cataract surgery and right arm tendon repair after trauma.   SOCIAL HISTORY: The patient is separated from his wife. He is telling me that he is living by himself right now, but he has family around that were not with him today. He smokes 1/2 pack a day. He states that he cut out a lot, but he has been smoking all of his life. He is not willing to quit. Tobacco quitting counseling has been given for over 5 minutes. The patient states that he is not ready to quit today. Alcohol abuse: The patient drinks significant amounts, in the past has been up to 1 pint of alcohol a day of gin, but he states that lately he has been drinking beer. Last drink was 2 days ago. No IV drug abuse.   FAMILY HISTORY: Positive for stroke in his mother and alcoholism in his father, who died in his 4160s from complications of that.   CURRENT MEDICATIONS: Prednisone 20 mg once daily, amlodipine 5 mg once daily, Spiriva 18 mcg inhaled once a day, Symbicort 160/4.5 two puffs twice daily, ProAir 2 puffs 4 times a day, One-A-Day vitamins. Those are the only medications that he takes.   PHYSICAL EXAMINATION:  VITAL SIGNS: Blood pressure 141/66, pulse 126, respirations 16, temperature 98.1, pulse oximetry of 99 on 2 liters of oxygen. GENERAL: Alert and oriented x 3. No acute distress. No respiratory distress. Hemodynamically stable.  HEENT: Pupils are equal and reactive. Extraocular movements are intact. Mucosae are slightly dry. Anicteric sclerae. Pink conjunctivae. No oral lesions.  NECK: Supple. No JVD. No thyromegaly. No adenopathy. No carotid bruits. No rigidity. No masses.  CARDIOVASCULAR: Regular rate and rhythm, but tachycardic. No murmurs, rubs or gallops are appreciated. No displacement of  PMI. No tenderness to palpation of anterior chest wall. Positive tenderness to palpation of rib cage at the level of the right 10th to 12th ribs.  RESPIRATORY: Decreased respiratory sounds in both lungs with some occasional rhonchi. No dullness to percussion. No use of accessory muscles.  ABDOMEN: Soft, nontender, nondistended. No hepatosplenomegaly. No masses. Bowel sounds are positive.  EXTREMITIES: No edema, no cyanosis, no clubbing. Pulses +2. Capillary refill about 3 seconds.  SKIN: Without any rashes or petechiae. Severe dry skin with lichenification all around arms, legs and chest. No bleeding lesions. The patient is normocephalic, atraumatic, but there is a laceration on the right occiput which has been fixed and sutured. He has crusted blood around the hair in that area.  NEUROLOGIC: Cranial nerves II through XII intact. No focal findings. Strength is 5/5 in 4 extremities.  LYMPHATIC: Negative for lymphadenopathy in neck or supraclavicular areas.  PSYCHIATRIC: Mood is slightly anxious and tremorous, which apparently is his normal pattern.  MUSCULOSKELETAL: No significant joint deformity or joint effusions.   LABORATORY,  DIAGNOSTIC AND RADIOLOGICAL DATA: Glucose 100, sodium 135, potassium 4.2, CO2 of 40, which has been chronically elevated. Calcium 8.2. LFTs with alkaline phosphatase of 146, which is lower than it was prior. His AST and ALT are normal. Albumin is 2.9. His white count is 19.2, hemoglobin 9.4, platelets 318. In the past, there is occult blood in feces which was positive. That was in October 2013, patient recommended to go to see her primary care doctor for colonoscopy.   EKG: Sinus tachycardia with heart rate around 120 to 130.   CT scans of the chest, head and neck: As discussed above in HPI.   ASSESSMENT AND PLAN: A 75 year old gentleman with history of chronic obstructive pulmonary disease, chronic respiratory failure, on 3 to 4 liters of oxygen, hypertension, who comes from  a fall with pulmonary contusion and rib fractures.  1.  Pulmonary contusion and rib fractures: The patient is admitted for observation, as his pulmonary function is very decreased, just to prevent any significant complications. At this moment he looks stable, but he has significant pain and since his pain is so severe, he is not taking deep breaths for what I am going to start him on nebulizers with Xopenex because he is tachycardic, pulmonary toilet, and try to avoid any atelectasis that potentially could decrease his respiratory function even further. Continue oxygen at 3 to 4 liters of oxygen, increase if necessary. As the patient has significant pain, we are going to treat his pain with Norco and possibly morphine if absolutely necessary. If there is worsening of his pulmonary function, we are going to consult pulmonary. Since Dr. Welton Flakes is actually his pulmonary doctor, we are going to let them know that the patient is here, if he can stop by and check on him.  2.  Chronic respiratory failure: At this moment does not seem to be exacerbated, but it potentially could get worse. We will continue with oxygen, pulmonary toilet and nebulizers. No need for increased steroids at this moment, as the patient is not wheezing.  3.  Status post fall: The patient had a significant fall with a laceration of his head. It was just a trip and fall with his oxygen line, no loss of consciousness. We are going to ask physical therapy to evaluate his gait.  4.  Anemia: The patient has anemia of chronic disease and possible chronic gastrointestinal bleeding. The patient has refused colonoscopies in the past. We encouraged him to have a colonoscopy with his primary care physician, as he had positive guaiacs in the past.  5.  Elevated white blood count: This is likely due to steroids, as the patient is on chronic steroids. We are going to follow up in the morning. At this moment, there are no signs of infection and no need or  indication for antibiotics. If white count is not trending down, consider a hematology/oncology consultation. The patient is also a smoker, for what the white count could be also potentially elevated due to this problem.  6.  Alcohol abuse: The patient is to be on [facility] Withdrawal Assessment (CIWA) protocol. At this moment he is shaking and having significant tachycardia.  7.  Tachycardia: Apparently this is a chronic process. He has been evaluated by his primary care physician for that. I do not see a TSH on his labs from previous hospitalization. We are going to order one today. We are going to give him a small amount of intravenous fluids since the patient looks clinically a little  bit dry.  8.  Dry skin: Continue steroids.  9.  Tobacco abuse: The patient has been counseled by myself for 5 minutes. The patient states that he is not ready to quit smoking.  10.  Deep vein thrombosis prophylaxis with heparin and gastrointestinal prophylaxis with H2-blocker.   CODE STATUS: The patient is a full code.   TIME SPENT: I spent about 45 minutes with this admission.    ____________________________ Felipa Furnace, MD rsg:jm D: 09/12/2012 14:28:23 ET T: 09/12/2012 16:51:50 ET JOB#: [id number]  cc: Felipa Furnace, MD, <Dictator> Luz Mares Juanda Chance MD ELECTRONICALLY SIGNED 09/25/2012 22:27

## 2014-10-08 NOTE — H&P (Signed)
PATIENT NAME:  Don [name], Don [name] MR#:  [id number] DATE OF BIRTH:  09-Feb-1941  DATE OF ADMISSION:  10/02/2012  PRIMARY CARE PHYSICIAN: Dr. Freda MunroSaadat Khan.   CHIEF COMPLAINT: Shortness of breath, cough and congestion.   HISTORY OF PRESENT ILLNESS: This is a 74 year old male who was sent over from his pulmonologist's office due to shortness of breath and cough, and noted to have O2 sats in the 60s to 70s. The patient was recently discharged from the hospital about a few weeks ago due to a fall and a pulmonary contusion. He has underlying end-stage COPD. The patient was sent home and started to smoke again at home. He developed worsening shortness of breath and went to see his pulmonologist today and his O2 sats were noted to be in his 60s to 870s. He was therefore referred to the hospital for admission due to COPD exacerbation. The patient says that on minimal exertion, he becomes short of breath. He admits to a cough which is productive with sometimes clear with green sputum. No nausea, no vomiting. Positive chills but no documented fever. No chest pain, no abdominal pain, no diarrhea and no other associated symptoms presently.   REVIEW OF SYSTEMS: CONSTITUTIONAL: No documented fever. No weight gain. No weight loss.  EYES: No blurry or double vision.  ENT: No tinnitus. No postnasal drip. No redness of the oropharynx.  RESPIRATORY: Positive cough. Positive wheeze. No hemoptysis. Positive COPD. CARDIOVASCULAR: No chest pain. No orthopnea. No palpitations. No syncope.  GASTROINTESTINAL: No nausea. No vomiting. No diarrhea. No abdominal pain. No melena or hematochezia.  GENITOURINARY: No dysuria or any hematuria.  ENDOCRINE: No polyuria or nocturia. No heat or cold intolerance. HEMATOLOGIC: No anemia, no bruising, no bleeding.  INTEGUMENTARY: No rashes. No lesions.  MUSCULOSKELETAL: No arthritis, no swelling, no gout.  NEUROLOGIC: No numbness or tingling. No ataxia. No seizure-type activity.  PSYCHIATRIC: No  anxiety. No insomnia. No ADD.   PAST MEDICAL HISTORY: Consistent with end-stage COPD and hypertension.   ALLERGIES: To ASPIRIN.   SOCIAL HISTORY: Still smokes occasionally 1 to 2 cigarettes per day, has been smoking for the past 40+ years. Occasional alcohol use. No illicit drug abuse. Lives at home with his daughter.   FAMILY HISTORY: The patient's mother died from complications of congestive heart failure. Father was an alcoholic and died from complications of liver cirrhosis.   CURRENT MEDICATIONS: As follows: Symbicort 160/4.5 two puffs [name], Spiriva one puff daily, albuterol inhaler two puffs q.6 hours as needed, prednisone 20 mg daily, multivitamin daily and Norvasc 5 mg daily.   PHYSICAL EXAMINATION: On admission is as follows: VITAL SIGNS: Noted to be temperature is 98.4, pulse 104, respirations 20, blood pressure 104/68, sats 94% on 3 liters nasal cannula.  GENERALLY: He is a cachectic appearing male in mild respiratory distress.  HEAD, EYES, EARS, NOSE, AND THROAT: He is atraumatic, normocephalic. Extraocular muscles are intact. Pupils equal and reactive to light. Sclerae anicteric. No conjunctival injection. No pharyngeal erythema.  NECK: Supple. There is no jugular venous distention, no bruits, no lymphadenopathy, no thyromegaly.  HEART: Tachycardic, regular. No murmurs, no rubs, no clicks.  LUNGS: He has prolonged inspiratory and expiratory phase, positive end expiratory wheezing, although negative use of accessory muscles. No dullness to percussion.  ABDOMEN: Soft, flat, nontender, nondistended. Has good bowel sounds. No hepatosplenomegaly appreciated.  EXTREMITIES: No evidence of any cyanosis, clubbing or peripheral edema. Has +2 pedal and radial pulses bilaterally.  NEUROLOGICALLY: The patient is alert, awake, oriented x 3 with  no focal motor or sensory deficits appreciated bilaterally.  SKIN: Moist, warm with no rashes appreciated.  LYMPHATIC: There is no cervical or  axillary lymphadenopathy.   LABORATORY EXAM: Currently pending.   ASSESSMENT AND PLAN: This is a 74 year old male with a history of end-stage chronic obstructive pulmonary disease and hypertension who presents to the hospital due to worsening shortness of breath and noted to have O2 saturations in the 60s to 70s at his pulmonologist's office.   1. COPD exacerbation: This is likely the cause of the patient's hypoxemia and shortness of breath. This is likely acute on chronic respiratory failure secondary to his COPD and ongoing tobacco abuse. I will admit the patient start him on IV steroids, around-the-clock nebulizer treatments, continue his Spiriva and Symbicort, place him on empiric Levaquin, follow sputum cultures, get a chest x-ray, follow his CBC. I will also get a Palliative Care consult and hospice screening as he has end-stage COPD for symptom management and possible hospice services at home.  2. Hypertension: The patient is presently hemodynamically stable. I will resume his Norvasc.  3. The patient is a full code.   Time spent on admission is 45 minutes.    ____________________________ Rolly Pancake. Cherlynn Kaiser, MD vjs:es [name]: 10/02/2012 15:35:46 ET T: 10/02/2012 15:44:36 ET JOB#: [id number]  cc: Rolly Pancake. Cherlynn Kaiser, MD, <Dictator> Houston Siren MD ELECTRONICALLY SIGNED 10/02/2012 21:42

## 2014-10-08 NOTE — Discharge Summary (Signed)
PATIENT NAME:  Don [name], [name] MR#:  [id number] DATE OF BIRTH:  11-Jun-1941  DATE OF ADMISSION:  10/02/2012  DATE OF DISCHARGE:  10/06/2012  DISCHARGE DIAGNOSES:   1.  Chronic obstructive pulmonary disease exacerbation.  2.  Acute respiratory failure secondary to chronic obstructive pulmonary disease.  3.  Hypotension.  4.  Cachexia.    CODE STATUS ON DISCHARGE: DO NOT RESUSCITATE.   DISCHARGE MEDICATIONS:  Amlodipine 5 mg oral tablet once a day, multivitamin once a day, prednisone 20 mg oral tablet once a day, Spiriva 18 mcg inhalation capsule once a day, Symbicort 2 puffs 2 times a day, ProAir 2 puffs 4 times a day as needed and levofloxacin 250 mg oral tablet once a day.   OXYGEN ON DISCHARGE: Two liters nasal cannula continuous.   DIET ON DISCHARGE: Low sodium.  Diet supplement: Ensure Plus. Diet consistency: Regular.   TIMEFRAME TO FOLLOW UP:  Within 1 to 2 weeks with Dr[facility] Fred[facility]roSaadat Khan[facility].    HISTORY OF PRESENTING ILLNESS:  A 74 year old male sent over from pulmonologist office for shortness of breath and noted to have oxygen saturation in 60s and 70s in his office.  He had end-stage COPD and recent admission to the hospital, was discharged with oxygen supplementation. As a followup, he went to his office, and he was severely short of breath. Minimal ambulation was making him short of breath with productive cough. No nausea or vomiting. Periodic chills, but no fever on presentation.   HOSPITAL COURSE AND STAY:  Due to his severe cachexia, end-stage chronic obstructive pulmonary disease and repeated worsening, palliative consult was done. The patient was agreeable to DNR STATUS and to go home with hospice services. He was started and maintained on treatment of chronic obstructive pulmonary disease with IV steroid, antibiotics and nebulizer bronchodilator treatments. He improved gradually and remained stable with his condition, and hospice services at home were arranged for  this terminal chronic obstructive pulmonary disease, and he was discharged home. He was using continuously every day 20 mg of prednisone, so discharged home  2.  Hypertension. The patient was stable and continued on Norvasc.  3.  Extremely poor prognosis and terminal chronic obstructive pulmonary disease. Discharged with hospice services at home.  IMPORTANT LABORATORY RESULTS IN THE HOSPITAL: Glucose 119, creatinine 0.58, CO2 level was elevated up to 14. WBC count was 11.9, hemoglobin 9.7. ABG on presentation: PH 7.43, pCO2 was 79 and pO2 was 58. Chest x-ray on presentation showed COPD, stable  nodular density in left midlung, which could be present callus formation.   TOTAL TIME SPENT ON THIS DISCHARGE: 45 minutes.  ____________________________ Hope PigeonVaibhavkumar G. [name], MD vgv:dmm D: 10/10/2012 08:55:04 ET T: 10/10/2012 09:47:09 ET JOB#: [id number]  cc: Hope PigeonVaibhavkumar G. [name], MD, <Dictator> Yevonne PaxSaadat A. Khan, MD Altamese DillingVAIBHAVKUMAR Abdifatah Colquhoun MD ELECTRONICALLY SIGNED 10/11/2012 22:50

## 2014-10-10 NOTE — Discharge Summary (Signed)
PATIENT NAME:  Don [name], Don [name] MR#:  [id number] DATE OF BIRTH:  [birth year]  DATE OF ADMISSION:  08/03/2011 DATE OF DISCHARGE:  08/06/2011  CHIEF COMPLAINT: Shortness of breath.   DISCHARGE DIAGNOSIS: Acute chronic obstructive pulmonary disease exacerbation.   SECONDARY DIAGNOSES:  1. Hypertension, not on any medications. 2. Left eye blindness. 3. Chronic obstructive pulmonary disease on 3 to 4 liters of oxygen via nasal cannula   DISCHARGE MEDICATIONS:  1. Symbicort 160/4.5 mcg, 2 puffs twice a day.  2. Spiriva 18 mcg one inhaled daily.  3. Albuterol metered dose inhaler 1 to 2 puffs every 4 to 6 hours as needed.  4. Tylenol 325 mg 1 to 2 tabs every 4 to 6 hours p.r.n.  5. Multivitamin 1 tab daily.  6. Prednisone 40 mg daily for one day, then 30 mg daily until seen by Dr. Gavin PottersKernodle. 7. Azithromycin 250 mg daily for one day.  OXYGEN: The patient will be discharged on 3 liters nasal cannula.   DIET: Low sodium, ADA diet.   ACTIVITY: As tolerated.   DISCHARGE FOLLOWUP: Please follow up with your PCP and lung doctor within 1 to 2 weeks.   HISTORY OF PRESENT ILLNESS: Please see the full History and Physical dictated on 08/03/2011 by Dr. 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   SIGNIFICANT LABS/IMAGING Initial BNP was 284 on 08/02/2011; however, it was not done on this admission. Initial creatinine was 0.58. CO2 36. LFTs were within normal limits. Troponin was negative. WBC 10.1.   Blood cultures: No growth to date times x2, from 08/03/2011.   Sputum cultures from 08/03/2011 showed few white blood cells, moderate epithelial cells, and moderate gram-positive cocci.  X-ray of the chest on 08/03/2011, PA and lateral: Findings are consistent chronic obstructive pulmonary disease.  Chronic prominence of the central pulmonary vascularity. No focal pneumonia.  PA and lateral x-ray of the chest, on 08/04/2011: No acute cardiopulmonary disease. Changes of chronic obstructive pulmonary disease are noted.   HOSPITAL COURSE: The patient was admitted to the hospitalist service for chronic obstructive pulmonary disease exacerbation as the patient had no significant leukocytosis or fevers and evidence for pneumonia on x-ray of the chest. He was started on IV steroids including azithromycin and his inhalers. He was also placed on his outpatient oxygen regimen. He improved dramatically with this and his IV steroids were converted to p.o. He is on 30 mg of prednisone as an outpatient, prescribed by Dr. Gavin PottersKernodle, for a skin condition. He is to follow up with Dr. Gavin PottersKernodle as an outpatient and until then take the 30 mg dose of prednisone. He will be discharged with an additional day of azithromycin for one day. He is to continue Spiriva, albuterol, and Symbicort as an outpatient. He is ambulating in the hall, his shortness of breath has significantly improved, and he is not wheezing anymore. At this point, he will be discharged with follow-up with his PCP and pulmonologist in 1 to 2 weeks.   DISPOSITION: Home.            CODE STATUS: FULL CODE.  TOTAL TIME SPENT: 35 minutes. ____________________________ Krystal EatonShayiq Kristena Wilhelmi, MD sa:slb [name]: 08/06/2011 12:46:22 ET T: 08/06/2011 13:40:19 ET JOB#: [id number]  cc: Krystal EatonShayiq Coralie Stanke, MD, <Dictator> Krystal EatonSHAYIQ Amiliana Foutz MD ELECTRONICALLY SIGNED 08/18/2011 8:55

## 2014-10-10 NOTE — H&P (Signed)
PATIENT NAME:  Don [name], Don [name] MR#:  [id number] DATE OF BIRTH:  [birth year]  DATE OF ADMISSION:  08/03/2011  PRIMARY CARE PHYSICIAN: Beverely RisenFozia Khan, MD   PULMONARY: Freda MunroSaadat Khan, MD    REQUESTING PHYSICIAN: Dr. Jens SomWiegand    CHIEF COMPLAINT: Shortness of breath.  HISTORY OF PRESENT ILLNESS: The patient is a 74 year old male with known history of bullous emphysema with chronic respiratory failure using 3 to 4 liters of oxygen chronically who was seen here in the Emergency Department yesterday for COPD exacerbation. He was discharged, did not do well, went to see his primary care physician who requested him to come to the Emergency Department again today to get admitted. He has been having shortness of breath and productive cough with yellowish sputum for the last 5 to 7 days. He is being admitted for further evaluation and management.   PAST MEDICAL HISTORY:  1. Hypertension.  2. Chronic obstructive pulmonary disease on chronic 4 liters of oxygen via nasal cannula continuous. 3. Left eye blindness.   PAST SURGICAL HISTORY:  1. Right arm tendon surgery from trauma.  2. Cataract surgery.   SOCIAL HISTORY: He smokes a pack per day. No alcohol or drug use.   ALLERGIES: Aspirin.   FAMILY HISTORY: Mother died of stroke. Father died of cancer.    MEDICATIONS AT HOME:  1. Spiriva once daily. 2. Symbicort 1 to 2 puffs twice a day, although patient does not remember taking this very well.  3. ProAir 2 puffs every 4 to 6 hours as needed.  4. 4 liters oxygen via nasal cannula continuous.  5. Vitamin B1 once a day.  6. Prednisone 30 mg p.o. daily prescribed by Dr. Gavin PottersKernodle.   REVIEW OF SYSTEMS: CONSTITUTIONAL: No fever. Positive fatigue and weakness. EYES: No blurry or double vision. Has left eye blindness. ENT: No tinnitus or ear pain. RESPIRATORY: Positive for productive cough with yellow sputum, also dyspnea on exertion. CARDIOVASCULAR: No chest pain, orthopnea, or edema. GI: No nausea, vomiting, or  diarrhea. GU: No dysuria or hematuria. ENDOCRINE: No polyuria or nocturia. HEMATOLOGY: No anemia or easy bruising. SKIN: No rash or lesion. MUSCULOSKELETAL: Arthritis. NEUROLOGIC: No tingling, numbness, or weakness. PSYCHIATRIC: No history of anxiety or depression.   PHYSICAL EXAMINATION:   VITAL SIGNS: Temperature 98.2, heart rate 112 per minute, respirations 28 per minute, blood pressure 170/79 mmHg. He is saturating 98% on 4 liters oxygen via nasal cannula.   GENERAL: The patient is a 74 year old male lying in the bed comfortably without any acute distress.  EYES: Pupils equal, round, reactive to light and accommodation. No scleral icterus. Extraocular muscles intact.   HEENT: Head atraumatic, normocephalic. Oropharynx and nasopharynx clear.   NECK: Supple. No jugular venous distention. No thyroid enlargement or tenderness.   LUNGS: Clear to auscultation bilaterally. No wheezing, rales, rhonchi, or crepitation.   CARDIOVASCULAR: S1, S2 normal, tachycardic. No murmur, rubs, or gallop.   ABDOMEN: Soft, nontender, nondistended. Bowel sounds present. No organomegaly or mass.   EXTREMITIES: No pedal edema, cyanosis, or clubbing.   NEUROLOGIC: Nonfocal examination. Cranial nerves III to XII intact. Muscle strength 5/5 in extremities. Sensation intact.   PSYCH: The patient is oriented to time, place, and person x3.   SKIN: He has eczema and red skin all over his extremities.   LABORATORY, DIAGNOSTIC, AND RADIOLOGICAL DATA: Normal BMP. Normal liver function tests. Normal first set of cardiac enzymes. Normal CBC. Chest x-ray showed no acute cardiopulmonary disease. No focal pneumonia. EKG shows sinus tach at a rate  of 107 per minute.   IMPRESSION AND PLAN:  1. Acute on chronic respiratory failure likely due to COPD exacerbation. Will start him on IV steroids and antibiotics. Consult Pulmonary, Dr. Freda Munro. Will continue Spiriva and Symbicort at this time.  2. Hypertension. He is not on  any medication. Will start him on metoprolol at this time as he is tachycardic. Also, his blood pressure was 170. Monitor his heart rate and blood pressure while in the hospital.  3. COPD exacerbation. Management as above. Pulmonary consult.  4. Tobacco abuse. The patient was counseled for about three minutes. He does request nicotine patch while in the hospital. He does not think he can quit that easily. Will prescribe 21 mg nicotine patch while in the hospital.   TOTAL TIME TAKING CARE OF THIS PATIENT: 50 minutes.   ____________________________ Ellamae Sia. Sherryll Burger, MD vss:drc [name]: 08/03/2011 16:39:48 ET T: 08/03/2011 17:53:17 ET JOB#: [id number]  cc: Sharissa Brierley S. Sherryll Burger, MD, <Dictator> Lyndon Code, MD [name], MD Ellamae Sia [facility] MD ELECTRONICALLY SIGNED 08/03/2011 23:13

## 2015-02-12 IMAGING — CT CT HEAD WITHOUT CONTRAST
1 series · 16 of 30 positions shown, 20 images · non-contrast
Comparison: none

REASON FOR EXAM: fall head injury
COMMENTS:

PROCEDURE:     CT  - CT HEAD WITHOUT CONTRAST  - September 12, 2012 [DATE]
RESULT:     History: Fall.
Comparison Study: No recent.

[Series 2: soft tissue · axial · 0.42mm/px · z∈[+1182,+1332]mm · 16 of 34 slices shown, 20 images]
[im 2/34  brain]
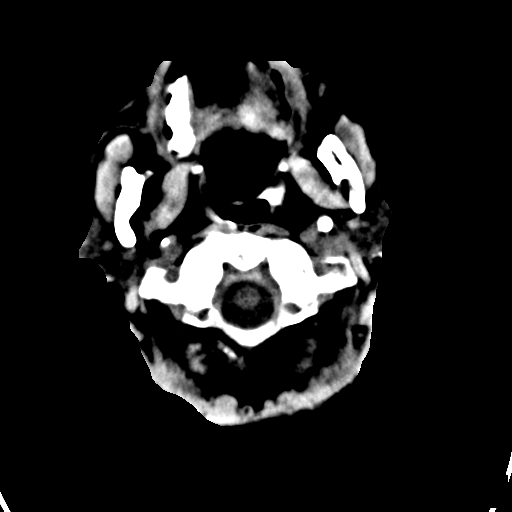
[im 2/34  bone]
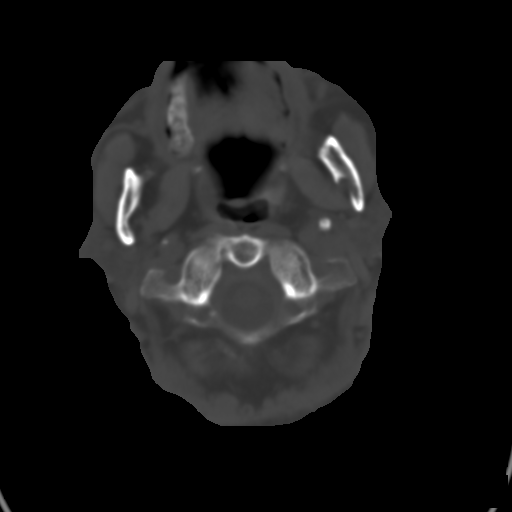
[im 4/34  brain]
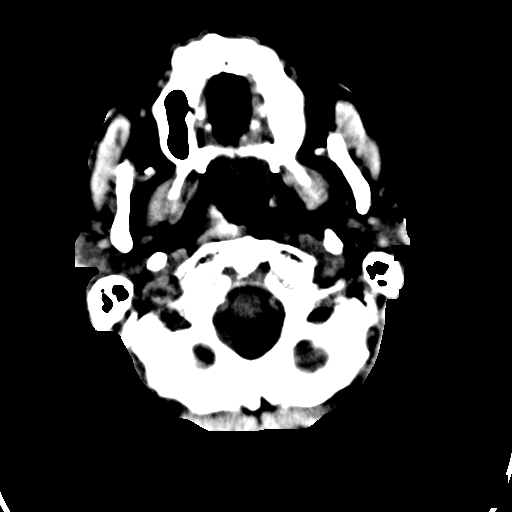
[im 6/34  brain]
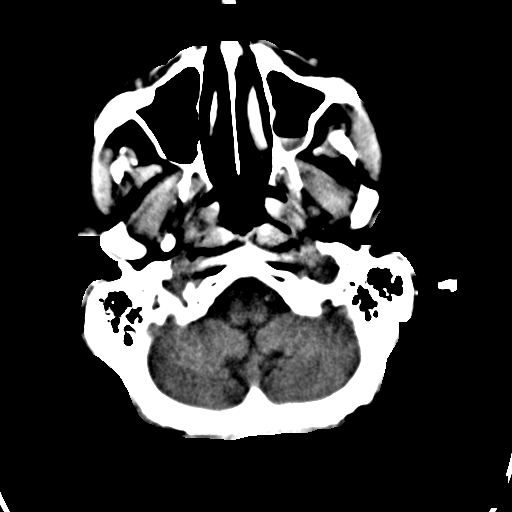
[im 8/34  brain]
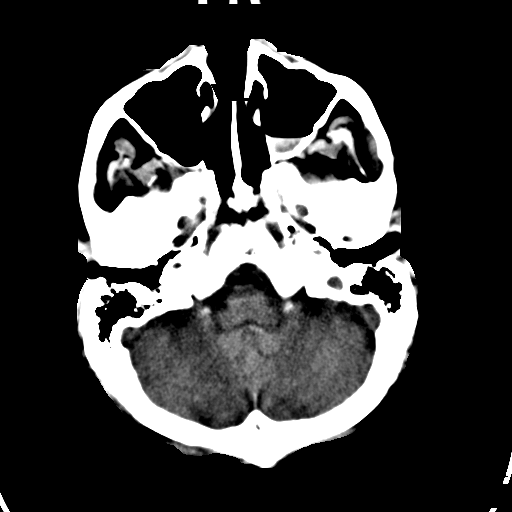
[im 10/34  brain]
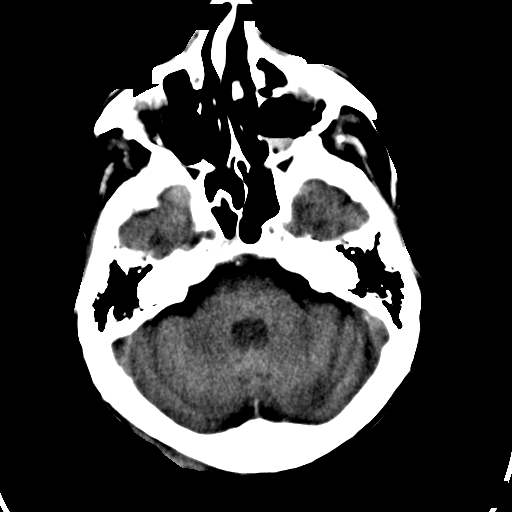
[im 10/34  bone]
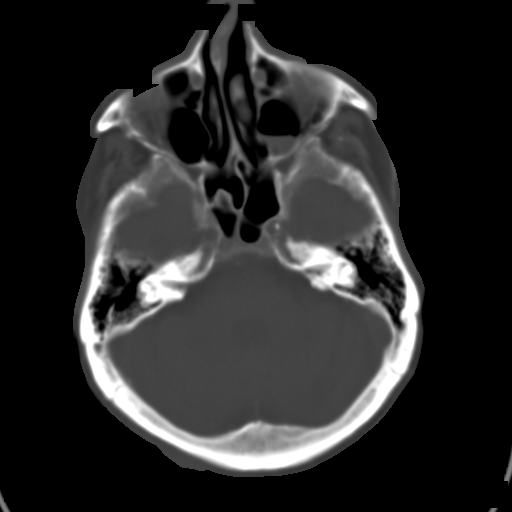
[im 12/34  brain]
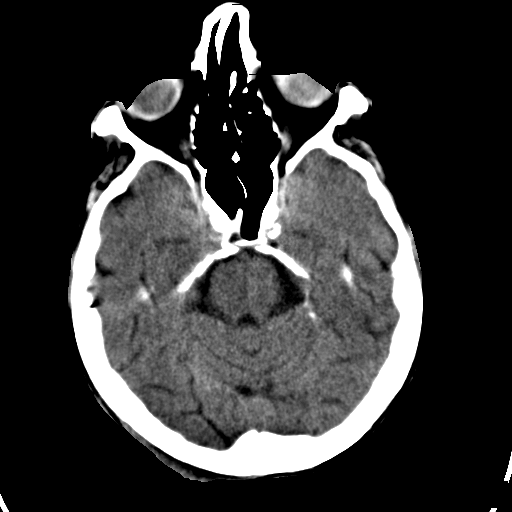
[im 14/34  brain]
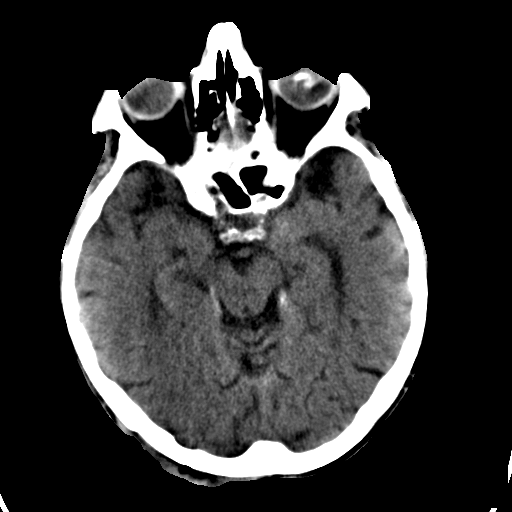
[im 16/34  brain]
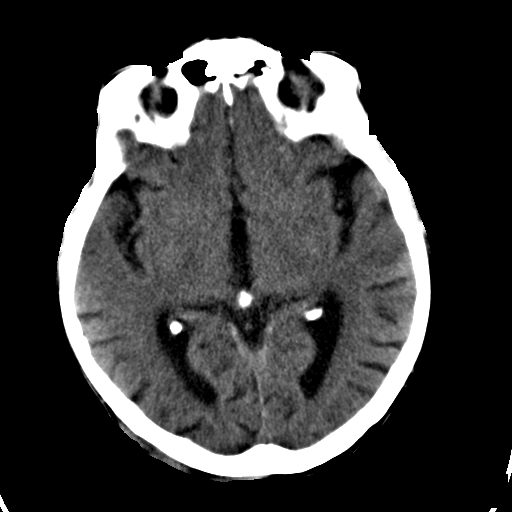
[im 18/34  brain]
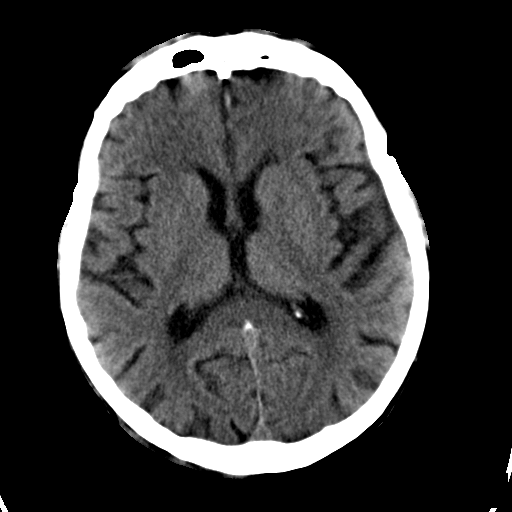
[im 18/34  bone]
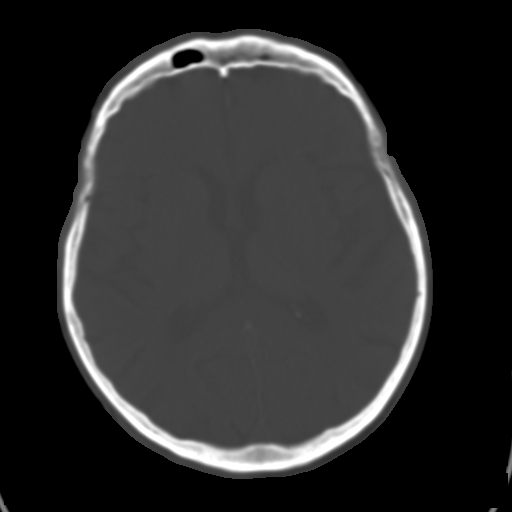
[im 20/34  brain]
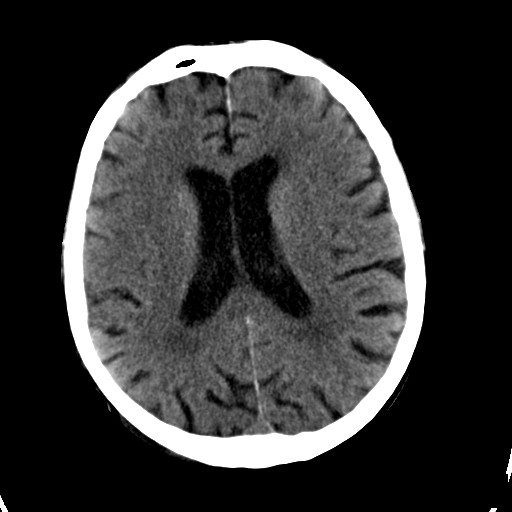
[im 22/34  brain]
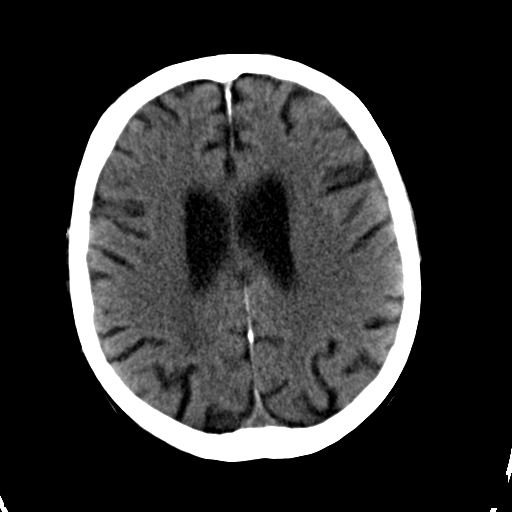
[im 24/34  brain]
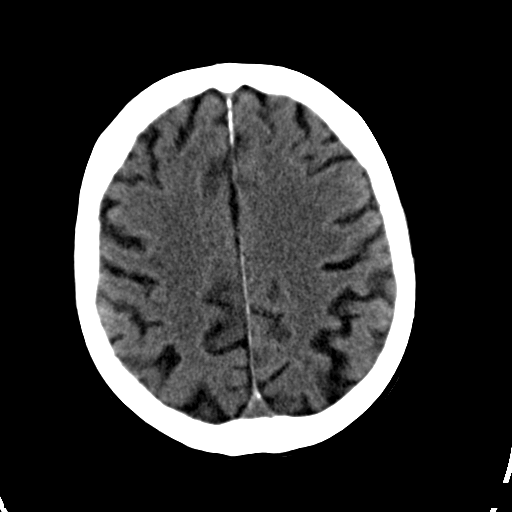
[im 26/34  brain]
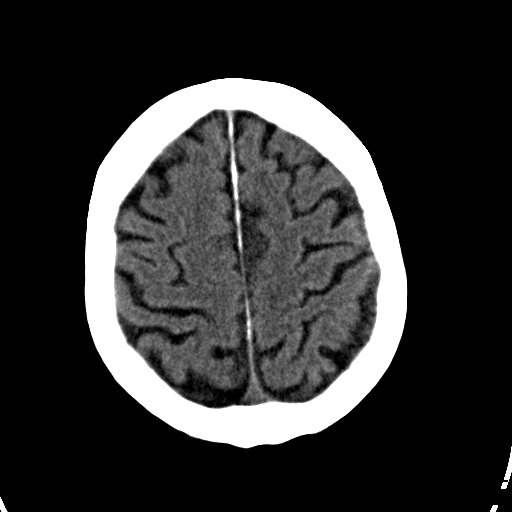
[im 26/34  bone]
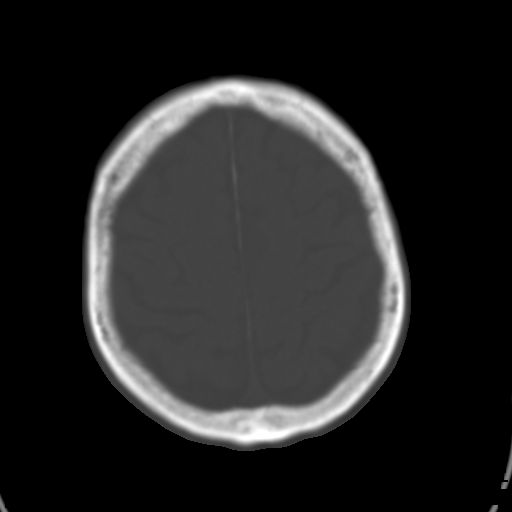
[im 28/34  brain]
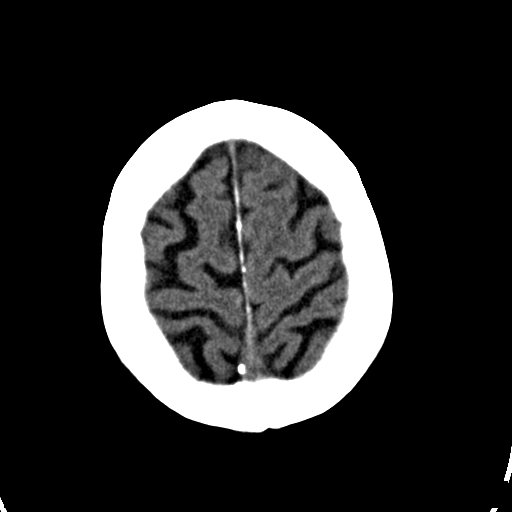
[im 30/34  brain]
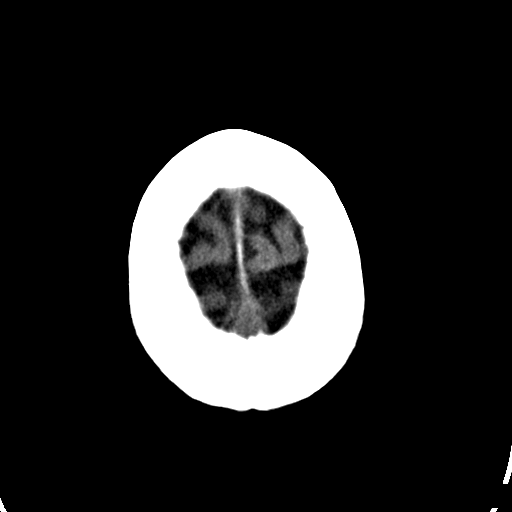
[im 32/34  brain]
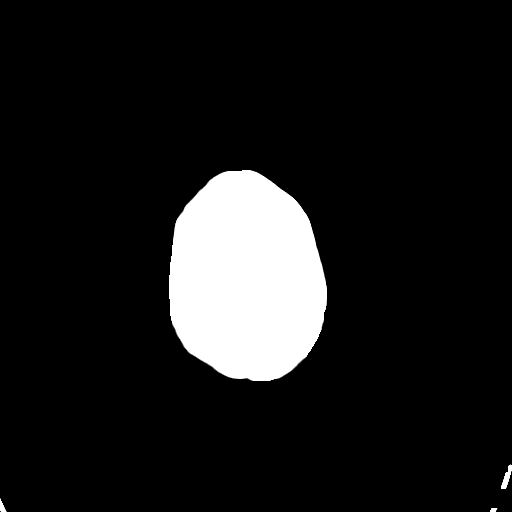

[16 of 30 positions shown; findings below may reference images not displayed]

FINDINGS: No mass. No hydrocephalus. No hemorrhage. White matter changes
consistent with chronic ischemia. No acute bony abnormality. Rounded
radiopaque density noted within the globe on the left. This could be
postsurgical. Clinical correlation suggested to exclude significant lesion .
Soft tissue swelling with laceration of the right occiput. No underlying
fracture. Air-fluid level in the left maxillary sinus. This can be secondary
to sinusitis.
IMPRESSION: 1. No acute intracranial abnormality. Soft tissue swelling is noted over the
right occiput with associated laceration. No fracture.
2. Left globe abnormality, this may be postsurgical. Clinical correlation
suggested.
3. Left maxillary sinusitis.
